# Patient Record
Sex: Female | Born: 1985 | Race: Black or African American | Hispanic: No | Marital: Married | Smoking: Never smoker
Health system: Southern US, Community
[De-identification: ages and names within clinical notes are randomized; demographics above are authoritative.]

## PROBLEM LIST (undated history)

## (undated) HISTORY — PX: HERNIA REPAIR: SHX51

## (undated) HISTORY — PX: TONSILLECTOMY: SUR1361

## (undated) HISTORY — PX: ADENOIDECTOMY: SUR15

## (undated) HISTORY — PX: MANDIBLE RECONSTRUCTION: SHX431

---

## 2007-05-18 ENCOUNTER — Ambulatory Visit (HOSPITAL_COMMUNITY): Admission: RE | Admit: 2007-05-18 | Discharge: 2007-05-19 | Payer: Self-pay | Admitting: Neurosurgery

## 2011-01-23 ENCOUNTER — Emergency Department (HOSPITAL_COMMUNITY)
Admission: EM | Admit: 2011-01-23 | Discharge: 2011-01-23 | Disposition: A | Discharge status: Home / Self Care | Payer: No Typology Code available for payment source | Attending: Emergency Medicine | Admitting: Emergency Medicine

## 2011-01-23 ENCOUNTER — Emergency Department (HOSPITAL_COMMUNITY): Payer: No Typology Code available for payment source

## 2011-01-23 DIAGNOSIS — R51 Headache: Secondary | ICD-10-CM | POA: Insufficient documentation

## 2011-01-23 DIAGNOSIS — M542 Cervicalgia: Secondary | ICD-10-CM | POA: Insufficient documentation

## 2011-01-23 DIAGNOSIS — H5710 Ocular pain, unspecified eye: Secondary | ICD-10-CM | POA: Insufficient documentation

## 2011-01-23 DIAGNOSIS — T148XXA Other injury of unspecified body region, initial encounter: Secondary | ICD-10-CM | POA: Insufficient documentation

## 2011-01-23 DIAGNOSIS — H5789 Other specified disorders of eye and adnexa: Secondary | ICD-10-CM | POA: Insufficient documentation

## 2011-01-23 DIAGNOSIS — S0510XA Contusion of eyeball and orbital tissues, unspecified eye, initial encounter: Secondary | ICD-10-CM | POA: Insufficient documentation

## 2011-01-23 DIAGNOSIS — J45909 Unspecified asthma, uncomplicated: Secondary | ICD-10-CM | POA: Insufficient documentation

## 2011-01-23 DIAGNOSIS — Y929 Unspecified place or not applicable: Secondary | ICD-10-CM | POA: Insufficient documentation

## 2011-03-02 NOTE — Discharge Summary (Signed)
Olivia Torres, Olivia Torres NO.:  [id number]   MEDICAL RECORD NO.:  [id number]          PATIENT TYPE:  OIB   LOCATION:  1539                         FACILITY:  [facility]   PHYSICIAN:  Grant Ruts., D.D.S.DATE OF BIRTH:  July 07, 1986   DATE OF ADMISSION:  05/18/2007  DATE OF DISCHARGE:  05/19/2007                               DISCHARGE SUMMARY   DISCHARGE NOTE AND SUMMARY.   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  She also had a secondary  diagnosis of asthma and she was admitted for a surgical correction of  the mandibular deficiency.  For a complete review of the physical  findings on admission, see the Admission Note and History and Physical.   LABORATORY DATA:  Her RBCs are 4.31, hemoglobin is 12, hematocrit 35.3,  normal indices.   SURGERY:  The patient was taken to surgery on the day of admission and  under general anesthesia a bilateral sagittal split osteotomy of the  mandible with advancement in rotation was completed with rigid fixation.  The patient tolerated the surgery and the anesthesia without  complications and a Class I anterior occlusal relationship was achieved  with the surgery to produce a functional bite.   COURSE IN THE HOSPITAL:  Postoperatively, the patient is now taking  fluids well and has had only minimal facial swelling.  Her airway and  respiratory has remained clear and she has had no asthmatic symptoms.  Home care was reviewed with the patient and her parents including  dietary instructions for full liquid diet, how to remove the maxillary  mandibular fixation should emesis and airway obstruction, and given a  pair of scissors to carry with her at all times.  Meticulous oral  hygiene instructions were reviewed with the patient and  her parents.   DISCHARGE MEDICATIONS:  1. Decadron 4 mg three times daily x3 days.  2. Percocet one tablet every 4 hours as needed for pain.  3. Keflex 750 mg twice daily.  4. Continue her regular asthmatic medications that include:      a.     Clarinex 5 mg daily.      b.     Singulair 10 mg daily.      c.     Albuterol inhaler as needed.   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   SECONDARY DIAGNOSIS:  Asthma.      Grant Ruts., D.D.S.  Electronically Signed     WB/MEDQ  D:  05/19/2007  T:  05/19/2007  Job:  [id number]

## 2011-03-02 NOTE — Op Note (Signed)
Olivia Torres, Olivia Torres           ACCOUNT NO.:  [id number]   MEDICAL RECORD NO.:  [id number]          PATIENT TYPE:  AMB   LOCATION:  DAY                          FACILITY:  [facility]   PHYSICIAN:  Grant Ruts., D.D.S.DATE OF BIRTH:  [birth year]   DATE OF PROCEDURE:  05/18/2007  DATE OF DISCHARGE:                               OPERATIVE REPORT   PREOPERATIVE DIAGNOSIS:  Mandibular sagittal deficiency with severe  class II malocclusion and mandibular asymmetry with deviation of the  mandibular midline significantly to the left with a complete buccal  crossbite on the right side.   POSTOPERATIVE DIAGNOSIS:  Mandibular sagittal deficiency with severe  class II malocclusion and mandibular asymmetry with deviation of the  mandibular midline significantly to the left with a complete buccal  crossbite on the right side.   PROCEDURE:  Bilateral sagittal osteotomy of the mandible with anterior  repositioning, rotation, and rigid fixation.   SURGEON:  Gwendlyn Deutscher, D.D.S.   INDICATIONS FOR PROCEDURE:  This 25 year old female has been followed in  our office for almost two years now for skeletal and occlusal  abnormalities and ability to chew food correctly.  She has been under  orthodontic care by Dr. Randall An to align the maxillary and  mandibular teeth in each individual arch.  Prior to surgery, surgical  models were completed and mounted on a semi-adjustable articulator to  allow planning of the surgical procedure.  The diagnosis was confirmed  to be severe mandibular sagittal deficiency with class II malocclusion,  rotation of the mandible to the left, and a complete cross-bite on the  right side, with a nonfunctional bite on the right side.   The surgical plan was to advance the mandible and rotate the mandible  into a class I molar and cuspid position.  This still left some width  deficiency of the mandible;however, it was felt with up-righting of the  mandibular posterior  teeth, a satisfactory occlusion could be achieved  and a significant improvement of her occlusion over her current bite  relationship.  A surgical splint guide was fabricated from the surgical  models to guide the positioning of the mandible during surgery.   Patient was taken to surgery and after satisfactory induction of  tracheal anesthesia, one moist sponge was placed in the oropharynx as a  throat pack, and the mouth was prepped with a Betadine scrub, Betadine  paint, as well as the face.  The patient was draped with four towels, a  split sheet, and a head sheet.   Using 0.5% Marcaine with 1:200,000 epinephrine, bilateral alveolar and  lingual nerve blocks were achieved for postoperative comfort and  hemostasis, and using 2% Xylocaine with 1:100,000 epinephrine,  infiltration along the anterior border of the ascending ramus and  mandible to reduce bleeding was completed.  Using electrocautery in the  cutting mode, an incision was made intraorally along the ascending ramus  of the mandible from the mid portion anteriorly and inferiorly to the  first molar tooth.  The incision was carried through the mucosa,  submucosa, buccinator muscle, periosteum, down to bone.  The temporalis  muscle fibers  were stripped superiorly to the coronoid process and the  subperiosteal dissection was carried out on the medial aspect of the  mandible, identifying the lingula.  Inferiorly on the lateral aspect,  the dissection was carried out subperiosteally to the inferior border,  and the mandibular notch.  Using appropriate retractors and a Lindeman  bur, a horizontal bone cut was made through the medial cortical plate  just superior to the lingula on the line of the plane of occlusion and  carried through the lingual cortical plate only.  Using a 701 bur, a  sagittal cut was made between the external and internal oblique ridges  from the medial cut to the second molar tooth, then using a 702 bur, a   cut was made through the inferior border of the mandible, through the  lateral cortical plate to connect with the sagittal cut.  Using thin  osteotomes and gradual splitting, the sagittal split was then deepened.  Finally, the split was completed with Katrinka Blazing splitting instruments in the  traditional manner.  The inferior alveolar neurovascular bundle was  noted to be partially contained in the lateral segment and was dissected  free without damage to the nerve and carefully placed in the distal  segment.   The mouth prop was moved to the right side, and a similar incision and  osteotomy was completed on the left ramus of the mandible.  The inferior  alveolar nerve was noted to be completely within the distal segment on  the left side of the mandible.  The mandible was freed up with a J  stripper along the inferior border on both sides and could easily be  advanced approximately 9 mm on the left side and 6 mm on the right side  and rotated to the right.   A surgical guide was then placed between the teeth, and the patient was  placed in intermaxillary fixation, thus fixating the teeth-sparing of  the mandible.  Attention was carried out to the proximal segments of the  osteotomy.  These were notched, and using posterior and superior  traction, the condyle was seated in the center portion of the glenoid  fossa, and the proximal segment was mortised to the distal segment.  On  the right side, it required some reduction of the bone structure to  allow the mortising to take place completely.  The inferior borders were  aligned and then using a modified Allis clamp, the proximal segment was  clamped to the distal segment without compression.  Using the KLS 2 mm  titanium screws, bicortical screws were placed through the superior  border of the osteotomy segments in a noncompression manner to fixate  the proximal segment to the distal segment.  Then 2 mm diameter 15 mm  screws were used on the  right side along with one 13 mm screw.  Three  screws were used on the left side and exactly on the right side, 15 mm  in length.  Two of the screws and the third screw was 13 mm in length.   The osteotomy segments were thoroughly irrigated.  They were tested and  noted to be in good rigid fixation.  The intermaxillary fixation was  removed, and the patient's occlusion was rechecked.  She rotated it with  the condyle seated exactly in the surgical splint guide.  The surgical  splint guide was removed, and she showed a class I occlusal relationship  on the cuspids and molars and midline alignment.  She  showed a slight  open bite in the posterior aspect that had been planned for on the  surgical models to allow up-righting of the posterior mandibular teeth  orthodontically.   Attention was carried out to the incisions.  These were thoroughly  irrigated.  Both incisions were closed with 4-0 Vicryl suture.   The throat pack was then removed.  The oropharynx was suctioned dry.  The patient was placed in the light  intermaxillary elastic fixation.  She was awakened and taken to the recovery room in stable condition.   Estimated blood loss is 150 cc.   COMPLICATIONS:  None.      Grant Ruts., D.D.S.  Electronically Signed     WB/MEDQ  D:  05/18/2007  T:  05/18/2007  Job:  [id number]

## 2011-08-02 LAB — CBC
HCT: 35.3 — ABNORMAL LOW
Hemoglobin: 12
MCHC: 34
MCV: 81.8 — ABNORMAL LOW
Platelets: 223
RBC: 4.31
RDW: 16.7 — ABNORMAL HIGH
WBC: 7.2

## 2011-08-02 LAB — PREGNANCY, URINE: Preg Test, Ur: NEGATIVE

## 2017-05-30 ENCOUNTER — Encounter (HOSPITAL_COMMUNITY): Payer: Self-pay | Admitting: Emergency Medicine

## 2017-05-30 ENCOUNTER — Emergency Department (HOSPITAL_COMMUNITY)
Admission: EM | Admit: 2017-05-30 | Discharge: 2017-05-30 | Disposition: A | Discharge status: Home / Self Care | Payer: 59 | Attending: Emergency Medicine | Admitting: Emergency Medicine

## 2017-05-30 ENCOUNTER — Emergency Department (HOSPITAL_COMMUNITY): Payer: 59

## 2017-05-30 DIAGNOSIS — N760 Acute vaginitis: Secondary | ICD-10-CM | POA: Diagnosis not present

## 2017-05-30 DIAGNOSIS — B9689 Other specified bacterial agents as the cause of diseases classified elsewhere: Secondary | ICD-10-CM

## 2017-05-30 DIAGNOSIS — R1031 Right lower quadrant pain: Secondary | ICD-10-CM | POA: Diagnosis not present

## 2017-05-30 DIAGNOSIS — R109 Unspecified abdominal pain: Secondary | ICD-10-CM

## 2017-05-30 LAB — COMPREHENSIVE METABOLIC PANEL
ALT: 12 U/L — AB (ref 14–54)
AST: 18 U/L (ref 15–41)
Albumin: 4.2 g/dL (ref 3.5–5.0)
Alkaline Phosphatase: 59 U/L (ref 38–126)
Anion gap: 8 (ref 5–15)
BUN: 9 mg/dL (ref 6–20)
CHLORIDE: 104 mmol/L (ref 101–111)
CO2: 25 mmol/L (ref 22–32)
CREATININE: 0.78 mg/dL (ref 0.44–1.00)
Calcium: 9.4 mg/dL (ref 8.9–10.3)
GFR calc non Af Amer: >60 mL/min (ref >60)
GLUCOSE: 100 mg/dL — AB (ref 65–99)
Potassium: 3.6 mmol/L (ref 3.5–5.1)
SODIUM: 137 mmol/L (ref 135–145)
Total Bilirubin: 0.5 mg/dL (ref 0.3–1.2)
Total Protein: 7.6 g/dL (ref 6.5–8.1)

## 2017-05-30 LAB — URINALYSIS, ROUTINE W REFLEX MICROSCOPIC
Bilirubin Urine: NEGATIVE
Glucose, UA: NEGATIVE mg/dL
Hgb urine dipstick: NEGATIVE
Ketones, ur: NEGATIVE mg/dL
Nitrite: NEGATIVE
Protein, ur: NEGATIVE mg/dL
SPECIFIC GRAVITY, URINE: 1.02 (ref 1.005–1.030)
pH: 7 (ref 5.0–8.0)

## 2017-05-30 LAB — WET PREP, GENITAL
SPERM: NONE SEEN
TRICH WET PREP: NONE SEEN
Yeast Wet Prep HPF POC: NONE SEEN

## 2017-05-30 LAB — CBC WITH DIFFERENTIAL/PLATELET
BASOS ABS: 0 10*3/uL (ref 0.0–0.1)
Basophils Relative: 1 %
EOS PCT: 4 %
Eosinophils Absolute: 0.3 10*3/uL (ref 0.0–0.7)
HEMATOCRIT: 37.1 % (ref 36.0–46.0)
HEMOGLOBIN: 12.4 g/dL (ref 12.0–15.0)
LYMPHS ABS: 2.6 10*3/uL (ref 0.7–4.0)
LYMPHS PCT: 34 %
MCH: 28.4 pg (ref 26.0–34.0)
MCHC: 33.4 g/dL (ref 30.0–36.0)
MCV: 85.1 fL (ref 78.0–100.0)
Monocytes Absolute: 0.3 10*3/uL (ref 0.1–1.0)
Monocytes Relative: 4 %
Neutro Abs: 4.3 10*3/uL (ref 1.7–7.7)
Neutrophils Relative %: 57 %
Platelets: 203 10*3/uL (ref 150–400)
RBC: 4.36 MIL/uL (ref 3.87–5.11)
RDW: 15.9 % — ABNORMAL HIGH (ref 11.5–15.5)
WBC: 7.6 10*3/uL (ref 4.0–10.5)

## 2017-05-30 LAB — PREGNANCY, URINE: PREG TEST UR: NEGATIVE

## 2017-05-30 LAB — LIPASE, BLOOD: Lipase: 26 U/L (ref 11–51)

## 2017-05-30 MED ORDER — TRAMADOL HCL 50 MG PO TABS: 50.0000 mg | ORAL_TABLET | Freq: Four times a day (QID) | ORAL | 0 refills | Status: AC | PRN

## 2017-05-30 MED ORDER — SODIUM CHLORIDE 0.9 % IV BOLUS (SEPSIS)
1000.0000 mL | Freq: Once | INTRAVENOUS | Status: AC
  Administered 2017-05-30: 1000 mL via INTRAVENOUS

## 2017-05-30 MED ORDER — METRONIDAZOLE 500 MG PO TABS: 500.0000 mg | ORAL_TABLET | Freq: Two times a day (BID) | ORAL | 0 refills | Status: AC

## 2017-05-30 MED ORDER — IOPAMIDOL (ISOVUE-300) INJECTION 61%
100.0000 mL | Freq: Once | INTRAVENOUS | Status: AC | PRN
  Administered 2017-05-30: 100 mL via INTRAVENOUS

## 2017-05-30 MED ORDER — KETOROLAC TROMETHAMINE 30 MG/ML IJ SOLN
30.0000 mg | Freq: Once | INTRAMUSCULAR | Status: AC
  Administered 2017-05-30: 30 mg via INTRAVENOUS
  Filled 2017-05-30: qty 1

## 2017-05-30 MED ORDER — IBUPROFEN 800 MG PO TABS: 800.0000 mg | ORAL_TABLET | Freq: Three times a day (TID) | ORAL | 0 refills | Status: AC | PRN

## 2017-05-30 NOTE — ED Provider Notes (Signed)
Emergency Department Provider Note   I have reviewed the triage vital signs and the nursing notes.   HISTORY  Chief Complaint Flank Pain   HPI Olivia Torres is a 31 y.o. female with PMH of bilateral inguinal hernia repair presents to the emergency department for evaluation of right lower quadrant abdominal pain over the past 2-3 days. Patient states she's had one to 2 weeks of bilateral back discomfort. This seemed to resolve around the time that the right lower quadrant pain began. Her last missed her period was 2 weeks ago. She denies any dysuria, hesitancy, urgency. No vaginal bleeding or discharge currently. No similar pain in the past. She has tried over-the-counter pain medications with no relief. She denies any associated nausea or vomiting. She continues to have regular bowel movements with no worsening pain. No rectal discomfort. She reports past surgical history of inguinal hernia repair bilaterally but her knowledge the appendix was not removed during these procedures. Denies fever or chills. No radiation of symptoms. Pain sometimes worse with movement.    History reviewed. No pertinent past medical history.  There are no active problems to display for this patient.   Past Surgical History:  Procedure Laterality Date  . ADENOIDECTOMY    . HERNIA REPAIR    . MANDIBLE RECONSTRUCTION    . TONSILLECTOMY        Allergies Augmentin [amoxicillin-pot clavulanate]; Beeswax; and Fish allergy  History reviewed. No pertinent family history.  Social History Social History  Substance Use Topics  . Smoking status: Never Smoker  . Smokeless tobacco: Never Used  . Alcohol use Yes     Comment: occ    Review of Systems  Constitutional: No fever/chills Eyes: No visual changes. ENT: No sore throat. Cardiovascular: Denies chest pain. Respiratory: Denies shortness of breath. Gastrointestinal: Positive RLQ abdominal pain.  No nausea, no vomiting.  No diarrhea.  No  constipation. Genitourinary: Negative for dysuria. Musculoskeletal: Negative for back pain. Skin: Negative for rash. Neurological: Negative for headaches, focal weakness or numbness.  10-point ROS otherwise negative.  ____________________________________________   PHYSICAL EXAM:  VITAL SIGNS: ED Triage Vitals  Enc Vitals Group     BP 05/30/17 0908 118/78     Pulse Rate 05/30/17 0908 83     Resp 05/30/17 0908 16     Temp 05/30/17 0908 98.7 F (37.1 C)     Temp Source 05/30/17 0908 Oral     SpO2 05/30/17 0908 100 %     Weight 05/30/17 0903 173 lb (78.5 kg)     Height 05/30/17 0903 5\' 5"  (1.651 m)     Pain Score 05/30/17 0903 10   Constitutional: Alert and oriented. Well appearing and in no acute distress. Notably uncomfortable when lying flat.  Eyes: Conjunctivae are normal.  Head: Atraumatic. Nose: No congestion/rhinnorhea. Mouth/Throat: Mucous membranes are moist.   Neck: No stridor.  Cardiovascular: Normal rate, regular rhythm. Good peripheral circulation. Grossly normal heart sounds.   Respiratory: Normal respiratory effort.  No retractions. Lungs CTAB. Gastrointestinal: Soft with focal RLQ tenderness to palpation. No rebound or guarding. Negative Murphy's sign. No distention. No CVA tenderness. GU exam with moderate discharge. No CMT. Mild right adnexal discomfort but no fullness. Normal left adnexa.  Musculoskeletal: No lower extremity tenderness nor edema. No gross deformities of extremities. Neurologic:  Normal speech and language. No gross focal neurologic deficits are appreciated.  Skin:  Skin is warm, dry and intact. No rash noted. Psychiatric: Mood and affect are normal. Speech and  behavior are normal.  ____________________________________________   LABS (all labs ordered are listed, but only abnormal results are displayed)  Labs Reviewed  WET PREP, GENITAL - Abnormal; Notable for the following:       Result Value   Clue Cells Wet Prep HPF POC PRESENT (*)      WBC, Wet Prep HPF POC RARE (*)    All other components within normal limits  URINALYSIS, ROUTINE W REFLEX MICROSCOPIC - Abnormal; Notable for the following:    APPearance HAZY (*)    Leukocytes, UA SMALL (*)    Bacteria, UA RARE (*)    Squamous Epithelial / LPF 6-30 (*)    All other components within normal limits  COMPREHENSIVE METABOLIC PANEL - Abnormal; Notable for the following:    Glucose, Bld 100 (*)    ALT 12 (*)    All other components within normal limits  CBC WITH DIFFERENTIAL/PLATELET - Abnormal; Notable for the following:    RDW 15.9 (*)    All other components within normal limits  PREGNANCY, URINE  LIPASE, BLOOD  GC/CHLAMYDIA PROBE AMP ([place]) NOT AT [facility]   ____________________________________________  RADIOLOGY  Ct Abdomen Pelvis W Contrast  Result Date: 05/30/2017 CLINICAL DATA:  Left flank pain for 2 weeks. Now has right flank pain. EXAM: CT ABDOMEN AND PELVIS WITH CONTRAST TECHNIQUE: Multidetector CT imaging of the abdomen and pelvis was performed using the standard protocol following bolus administration of intravenous contrast. CONTRAST:  ISOVUE-300 IOPAMIDOL (ISOVUE-300) INJECTION 61% COMPARISON:  None. FINDINGS: Lower chest: No acute abnormality. Hepatobiliary: No focal liver abnormality is seen. No gallstones, gallbladder wall thickening, or biliary dilatation. Pancreas: Unremarkable. No pancreatic ductal dilatation or surrounding inflammatory changes. Spleen: Normal in size without focal abnormality. Adrenals/Urinary Tract: Adrenal glands are unremarkable. Kidneys are normal, without renal calculi, focal lesion, or hydronephrosis. Bladder is unremarkable. Stomach/Bowel: Stomach is within normal limits. Appendix appears normal. No evidence of bowel wall thickening, distention, or inflammatory changes. Vascular/Lymphatic: No significant vascular findings are present. No enlarged abdominal or pelvic lymph nodes. Reproductive: Uterus and bilateral adnexa  are unremarkable. 16 mm dominant left ovarian follicle. Other: No abdominal wall hernia or abnormality. Small amount of pelvic free fluid. Musculoskeletal: No acute osseous abnormality. No lytic or sclerotic osseous lesion. IMPRESSION: 1. No acute abdominal or pelvic pathology. 2. Normal appendix. 3. No urolithiasis or obstructive uropathy. Electronically Signed   By: Elige Ko   On: 05/30/2017 11:37    ____________________________________________   PROCEDURES  Procedure(s) performed:   Procedures  None ____________________________________________   INITIAL IMPRESSION / ASSESSMENT AND PLAN / ED COURSE  Pertinent labs & imaging results that were available during my care of the patient were reviewed by me and considered in my medical decision making (see chart for details).  Patient presents to the emergency department for evaluation of right lower quadrant abdominal pain. Symptoms have been ongoing for 2-3 days. She has focal tenderness in the right lower quadrant. She has a history of bilateral hernia repair but believes that her appendix is still in place. Negative Murphy's sign. LMP 2 weeks prior. No fever or chills. Plan for CT abdomen/pelvis with contrast, labs, and pain control.   Differential diagnosis includes but is not exclusive to ectopic pregnancy, ovarian cyst, ovarian torsion, acute appendicitis, urinary tract infection, endometriosis, bowel obstruction, hernia, colitis, renal colic, gastroenteritis, volvulus etc.  CT scan with no acute findings. Pelvic exam with no cervical motion tenderness. She does have clue cells which correlate clinically with her  moderate vaginal discharge. Plan for treatment with Flagyl and discharge home with primary care physician follow-up. No clinical evidence to suggest ovarian torsion. Normal appearing ovaries on CT so low suspicion for TOA. Patient is feeling better at discharge. Plan for PCP and Gyn follow up. Discussed return precautions in  detail.   At this time, I do not feel there is any life-threatening condition present. I have reviewed and discussed all results (EKG, imaging, lab, urine as appropriate), exam findings with patient. I have reviewed nursing notes and appropriate previous records.  I feel the patient is safe to be discharged home without further emergent workup. Discussed usual and customary return precautions. Patient and family (if present) verbalize understanding and are comfortable with this plan.  Patient will follow-up with their primary care provider. If they do not have a primary care provider, information for follow-up has been provided to them. All questions have been answered.   ____________________________________________  FINAL CLINICAL IMPRESSION(S) / ED DIAGNOSES  Final diagnoses:  Right flank pain  Right lower quadrant abdominal pain  Bacterial vaginosis     MEDICATIONS GIVEN DURING THIS VISIT:  Medications  sodium chloride 0.9 % bolus 1,000 mL (0 mLs Intravenous Stopped 05/30/17 1117)  ketorolac (TORADOL) 30 MG/ML injection 30 mg (30 mg Intravenous Given 05/30/17 0939)  iopamidol (ISOVUE-300) 61 % injection 100 mL (100 mLs Intravenous Contrast Given 05/30/17 1122)     NEW OUTPATIENT MEDICATIONS STARTED DURING THIS VISIT:  Discharge Medication List as of 05/30/2017  1:13 PM    START taking these medications   Details  ibuprofen (ADVIL,MOTRIN) 800 MG tablet Take 1 tablet (800 mg total) by mouth every 8 (eight) hours as needed., Starting Mon 05/30/2017, Print    metroNIDAZOLE (FLAGYL) 500 MG tablet Take 1 tablet (500 mg total) by mouth 2 (two) times daily., Starting Mon 05/30/2017, Print    traMADol (ULTRAM) 50 MG tablet Take 1 tablet (50 mg total) by mouth every 6 (six) hours as needed., Starting Mon 05/30/2017, Print          Note:  This document was prepared using Dragon voice recognition software and may include unintentional dictation errors.  Alona BeneJoshua Breunna Nordmann, MD Emergency  Medicine    Sante Biedermann, Arlyss RepressJoshua G, MD 05/30/17 [phone]

## 2017-05-30 NOTE — Discharge Instructions (Signed)

## 2017-05-30 NOTE — ED Triage Notes (Signed)
Pt c/o pain 2 weeks ago to left flank area, then to right flank and now around to r upper and loer abd and lower back pain. Denies gu sx's and states has normal bms. Nad.

## 2017-05-30 NOTE — ED Notes (Signed)
Pt made aware to return if symptoms worsen or if any life threatening symptoms occur.   

## 2017-06-01 LAB — GC/CHLAMYDIA PROBE AMP (~~LOC~~) NOT AT ARMC
CHLAMYDIA, DNA PROBE: NEGATIVE
Neisseria Gonorrhea: NEGATIVE

## 2017-07-07 ENCOUNTER — Encounter: Payer: Self-pay | Admitting: Adult Health

## 2018-01-09 IMAGING — CT CT ABD-PELV W/ CM
2 of 3 series · 16 of 46 positions shown, 18 images · IV contrast (Isovue)
Comparison: None.

CLINICAL DATA: Left flank pain for 2 weeks. Now has right flank
pain.

EXAM:
CT ABDOMEN AND PELVIS WITH CONTRAST
TECHNIQUE: Multidetector CT imaging of the abdomen and pelvis was performed
using the standard protocol following bolus administration of
intravenous contrast.
CONTRAST:  100mL R2T21T-HGG IOPAMIDOL (R2T21T-HGG) INJECTION 61%

[Series 2: axial st · axial · 0.60mm/px · z∈[+1026,+1421]mm · 13 of 91 slices shown, 15 images]
[im 6/91  soft-tissue]
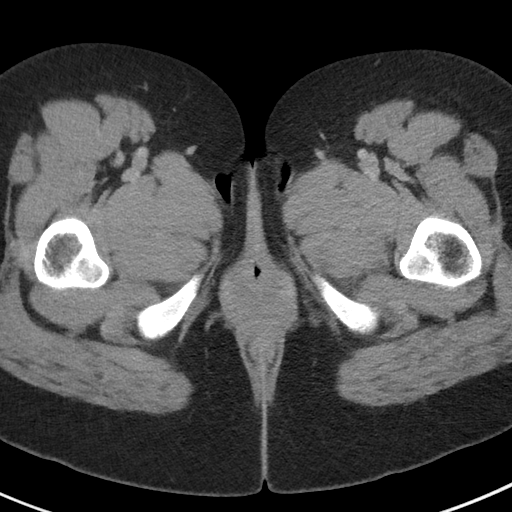
[im 6/91  bone]
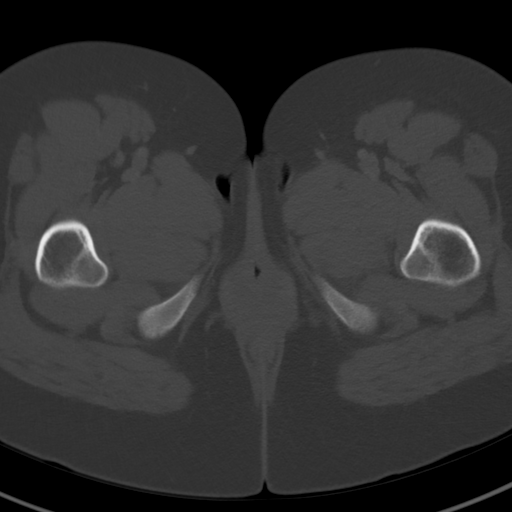
[im 12/91  soft-tissue]
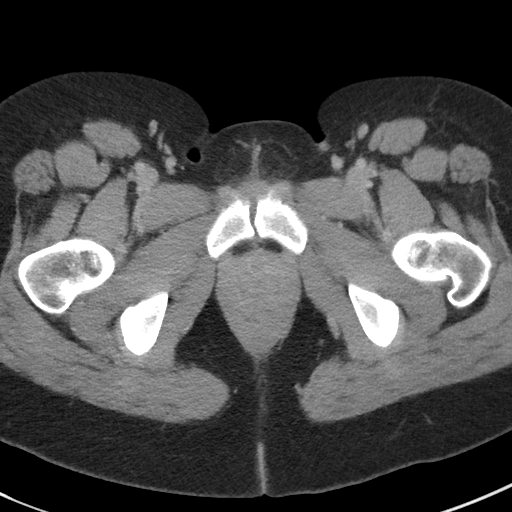
[im 18/91  soft-tissue]
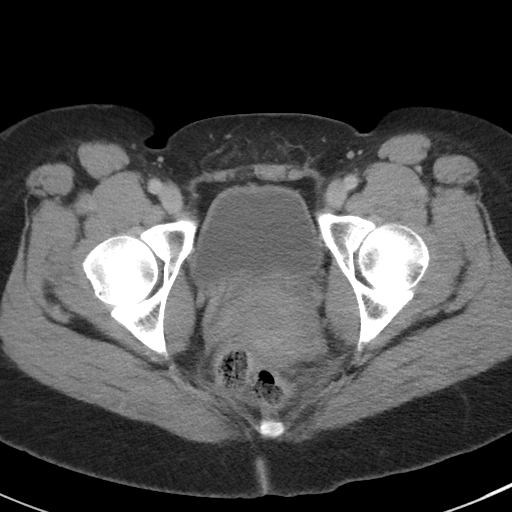
[im 27/91  soft-tissue]
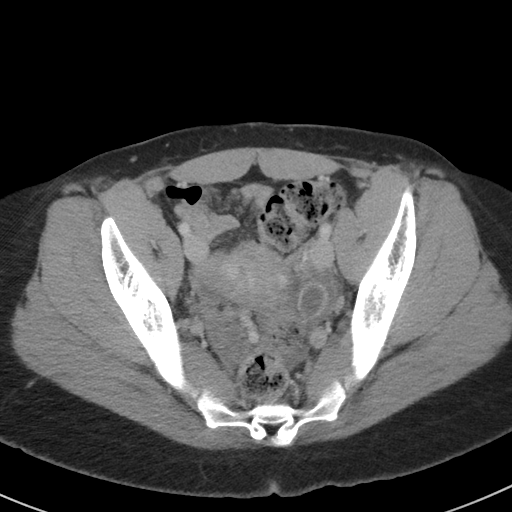
[im 32/91  soft-tissue]
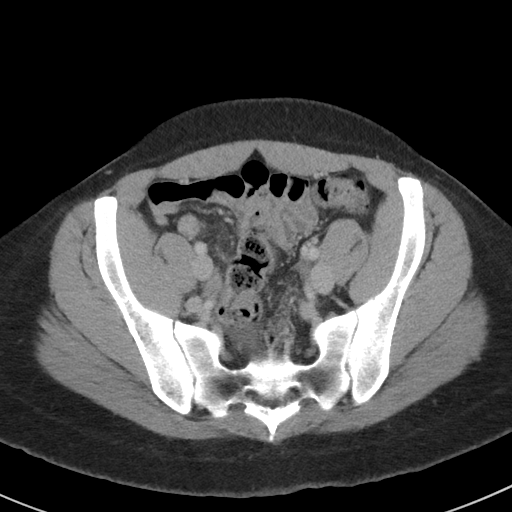
[im 38/91  soft-tissue]
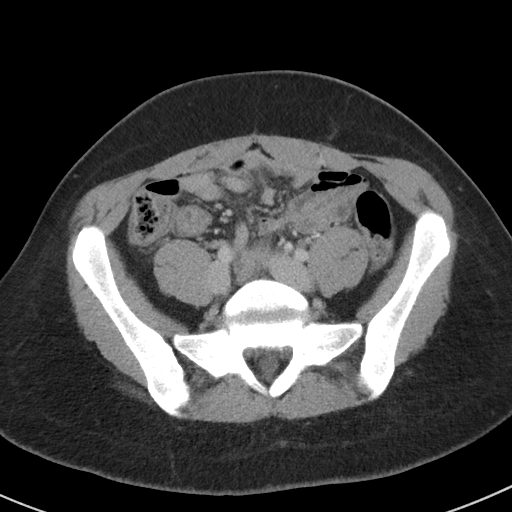
[im 47/91  soft-tissue]
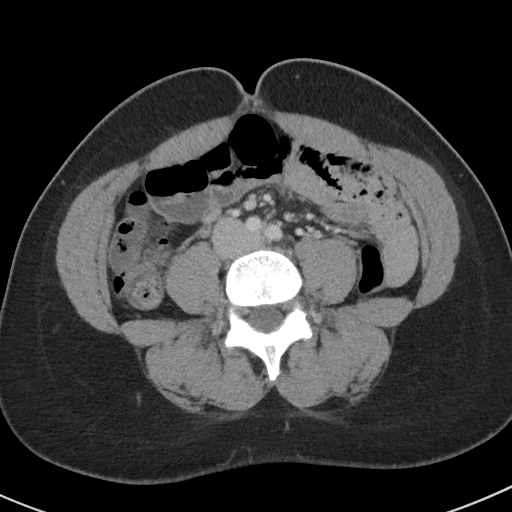
[im 53/91  soft-tissue]
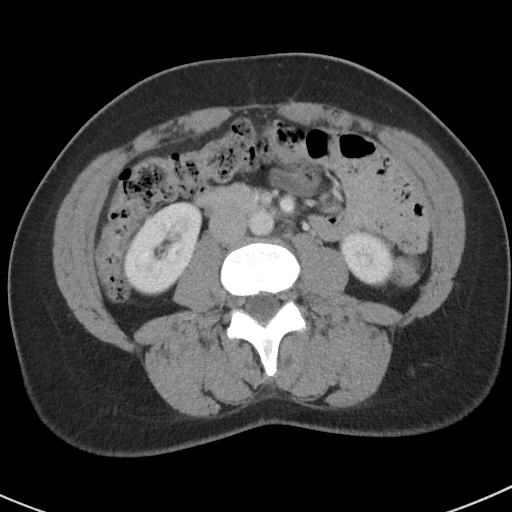
[im 59/91  soft-tissue]
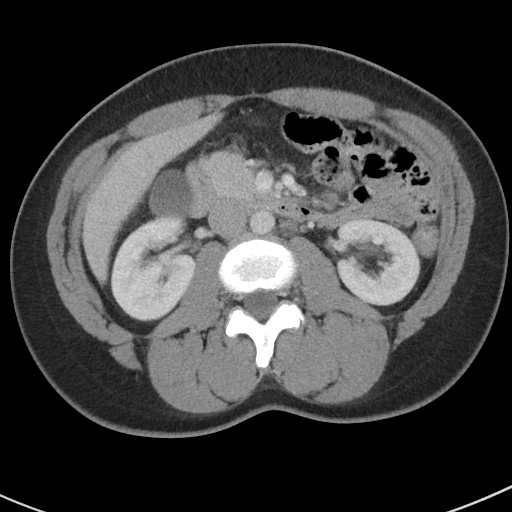
[im 59/91  bone]
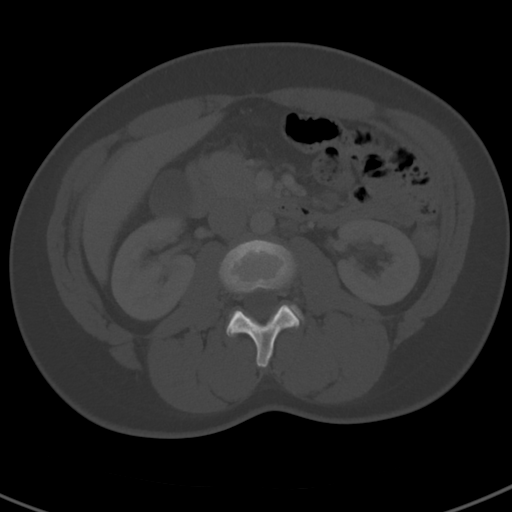
[im 64/91  soft-tissue]
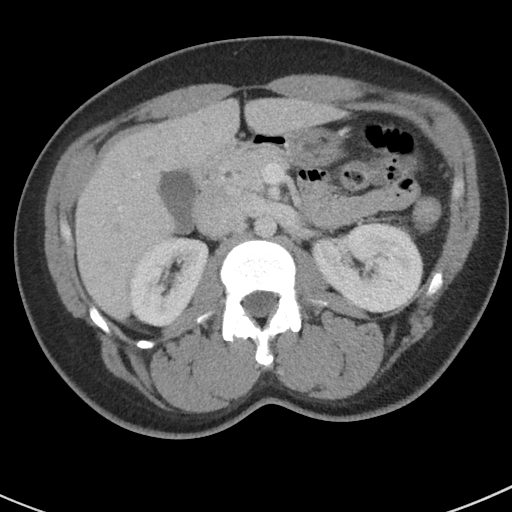
[im 73/91  soft-tissue]
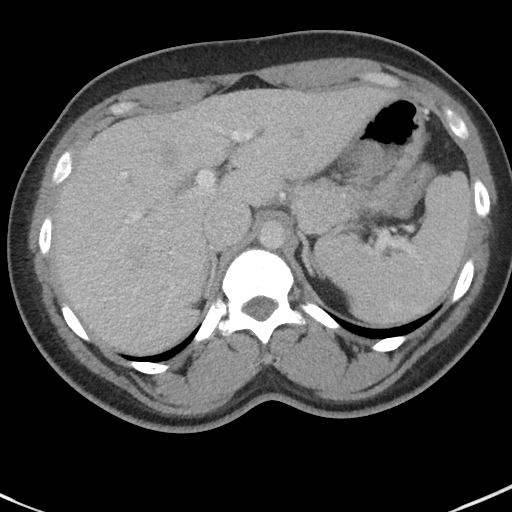
[im 79/91  soft-tissue]
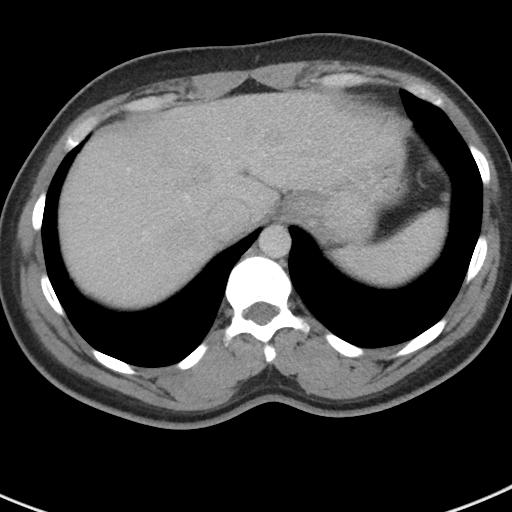
[im 85/91  soft-tissue]
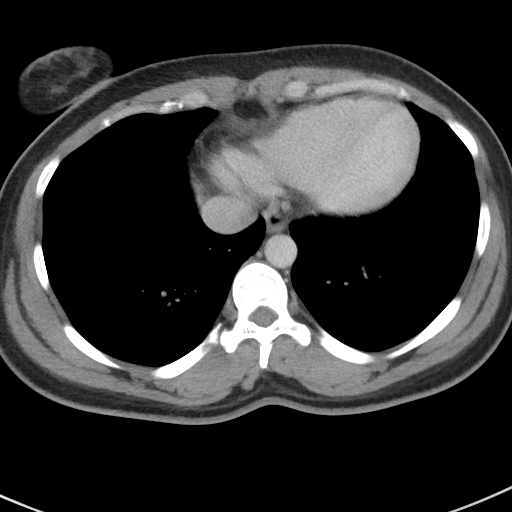

[Series 5: coronal st · coronal · 0.67mm/px · 3 of 86 slices shown]
[im 29/86  soft-tissue]
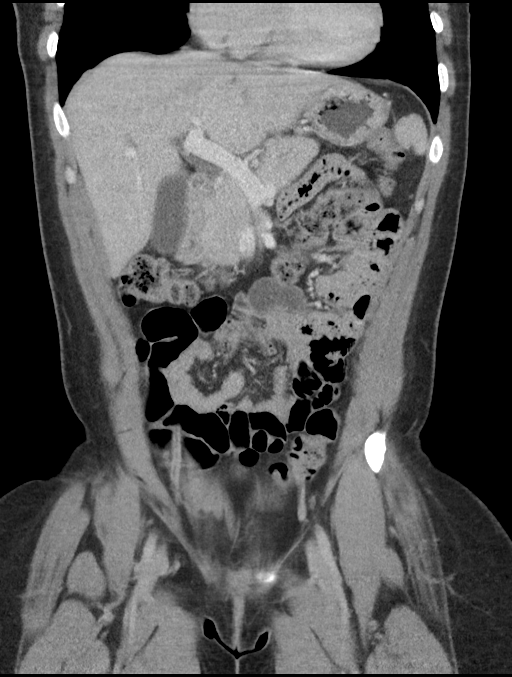
[im 38/86  soft-tissue]
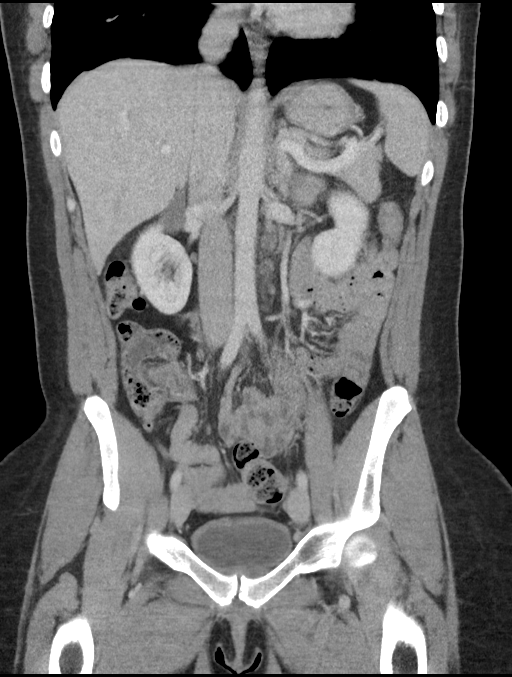
[im 48/86  soft-tissue]
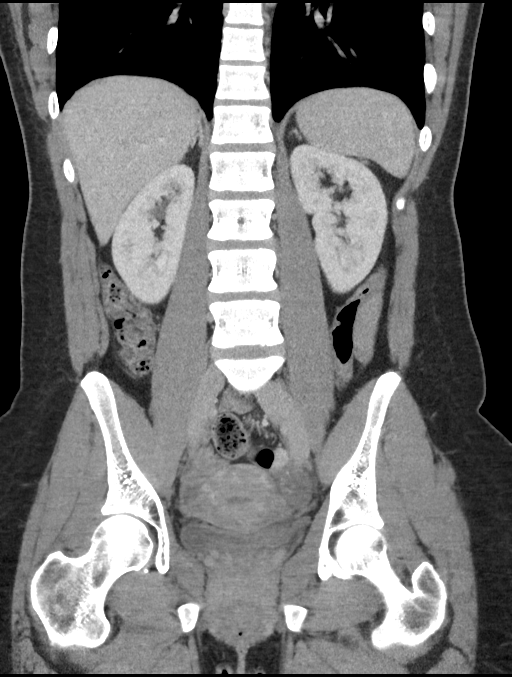

[16 of 46 positions shown; findings below may reference images not displayed]

FINDINGS: Lower chest: No acute abnormality.

Hepatobiliary: No focal liver abnormality is seen. No gallstones,
gallbladder wall thickening, or biliary dilatation.

Pancreas: Unremarkable. No pancreatic ductal dilatation or
surrounding inflammatory changes.

Spleen: Normal in size without focal abnormality.

Adrenals/Urinary Tract: Adrenal glands are unremarkable. Kidneys are
normal, without renal calculi, focal lesion, or hydronephrosis.
Bladder is unremarkable.

Stomach/Bowel: Stomach is within normal limits. Appendix appears
normal. No evidence of bowel wall thickening, distention, or
inflammatory changes.

Vascular/Lymphatic: No significant vascular findings are present. No
enlarged abdominal or pelvic lymph nodes.

Reproductive: Uterus and bilateral adnexa are unremarkable. 16 mm
dominant left ovarian follicle.

Other: No abdominal wall hernia or abnormality. Small amount of
pelvic free fluid.

Musculoskeletal: No acute osseous abnormality. No lytic or sclerotic
osseous lesion.
IMPRESSION: 1. No acute abdominal or pelvic pathology.
2. Normal appendix.
3. No urolithiasis or obstructive uropathy.

## 2020-04-15 ENCOUNTER — Ambulatory Visit (HOSPITAL_COMMUNITY): Payer: 59

## 2020-04-15 ENCOUNTER — Ambulatory Visit
Admission: EM | Admit: 2020-04-15 | Discharge: 2020-04-15 | Disposition: A | Discharge status: Home / Self Care | Payer: 59

## 2020-04-15 ENCOUNTER — Other Ambulatory Visit: Payer: Self-pay

## 2020-04-15 ENCOUNTER — Ambulatory Visit (INDEPENDENT_AMBULATORY_CARE_PROVIDER_SITE_OTHER): Payer: 59

## 2020-04-15 ENCOUNTER — Ambulatory Visit: Payer: 59

## 2020-04-15 ENCOUNTER — Encounter: Payer: Self-pay | Admitting: Emergency Medicine

## 2020-04-15 DIAGNOSIS — M79641 Pain in right hand: Secondary | ICD-10-CM | POA: Diagnosis not present

## 2020-04-15 DIAGNOSIS — M25531 Pain in right wrist: Secondary | ICD-10-CM

## 2020-04-15 MED ORDER — PREDNISONE 10 MG PO TABS: 20.0000 mg | ORAL_TABLET | Freq: Every day | ORAL | 0 refills | Status: AC

## 2020-04-15 NOTE — ED Provider Notes (Addendum)
[facility]   [id number] 04/15/20 Arrival Time: 0835   Chief Complaint  Patient presents with  . Hand Pain    SUBJECTIVE: History from: patient.  Olivia Torres is a 34 y.o. female who presented to the urgent care with a complaint of right hand/ wrist pain for the past 3 to 4 years.  Reports she developed pain after she fell a pop in her hand while taking care of her daughter.  She localizes the pain to the right hand/wrist.  She describes the pain as constant and achy.  She has tried OTC medications without relief.  His symptoms are made worse with ROM.  She denies similar symptoms in the past.  Denies chills, fever, nausea, vomiting, diarrhea.   ROS: As per HPI.  All other pertinent ROS negative.      History reviewed. No pertinent past medical history. Past Surgical History:  Procedure Laterality Date  . ADENOIDECTOMY    . HERNIA REPAIR    . MANDIBLE RECONSTRUCTION    . TONSILLECTOMY     Allergies  Allergen Reactions  . Augmentin [Amoxicillin-Pot Clavulanate]   . Beeswax   . Fish Allergy    No current facility-administered medications on file prior to encounter.   Current Outpatient Medications on File Prior to Encounter  Medication Sig Dispense Refill  . Multiple Vitamin (MULTIVITAMIN) tablet Take 1 tablet by mouth daily.    Marland Kitchen ibuprofen (ADVIL,MOTRIN) 800 MG tablet Take 1 tablet (800 mg total) by mouth every 8 (eight) hours as needed. 21 tablet 0  . metroNIDAZOLE (FLAGYL) 500 MG tablet Take 1 tablet (500 mg total) by mouth 2 (two) times daily. 14 tablet 0  . naproxen sodium (ANAPROX) 220 MG tablet Take 440 mg by mouth 2 (two) times daily with a meal.    . traMADol (ULTRAM) 50 MG tablet Take 1 tablet (50 mg total) by mouth every 6 (six) hours as needed. 15 tablet 0   Social History   Socioeconomic History  . Marital status: Married    Spouse name: Not on file  . Number of children: Not on file  . Years of education: Not on file  . Highest  education level: Not on file  Occupational History  . Not on file  Tobacco Use  . Smoking status: Never Smoker  . Smokeless tobacco: Never Used  Vaping Use  . Vaping Use: Never used  Substance and Sexual Activity  . Alcohol use: Yes    Comment: occ  . Drug use: No  . Sexual activity: Not on file  Other Topics Concern  . Not on file  Social History Narrative  . Not on file   Social Determinants of Health   Financial Resource Strain:   . Difficulty of Paying Living Expenses:   Food Insecurity:   . Worried About Programme researcher, broadcasting/film/video in the Last Year:   . Barista in the Last Year:   Transportation Needs:   . Freight forwarder (Medical):   Marland Kitchen Lack of Transportation (Non-Medical):   Physical Activity:   . Days of Exercise per Week:   . Minutes of Exercise per Session:   Stress:   . Feeling of Stress :   Social Connections:   . Frequency of Communication with Friends and Family:   . Frequency of Social Gatherings with Friends and Family:   . Attends Religious Services:   . Active Member of Clubs or Organizations:   . Attends Banker Meetings:   .  Marital Status:   Intimate Partner Violence:   . Fear of Current or Ex-Partner:   . Emotionally Abused:   Marland Kitchen Physically Abused:   . Sexually Abused:    Family History  Problem Relation Age of Onset  . Hypertension Mother   . Diabetes Mother     OBJECTIVE:  Vitals:   04/15/20 0849 04/15/20 0850  BP: 122/76   Pulse: 75   Resp: 17   Temp: 98.2 F (36.8 C)   TempSrc: Oral   SpO2: 97%   Weight:  209 lb (94.8 kg)  Height:  5\' 6"  (1.676 m)     Physical Exam Vitals and nursing note reviewed.  Constitutional:      General: She is not in acute distress.    Appearance: Normal appearance. She is normal weight. She is not ill-appearing, toxic-appearing or diaphoretic.  Cardiovascular:     Rate and Rhythm: Normal rate and regular rhythm.     Pulses: Normal pulses.     Heart sounds: Normal heart  sounds. No murmur heard.  No friction rub. No gallop.   Pulmonary:     Effort: Pulmonary effort is normal. No respiratory distress.     Breath sounds: Normal breath sounds. No stridor. No wheezing, rhonchi or rales.  Chest:     Chest wall: No tenderness.  Musculoskeletal:        General: Tenderness present. No swelling, deformity or signs of injury.     Right lower leg: No edema.     Left lower leg: No edema.     Comments: The right hand is without obvious deformity when compared to the left hand.  No swelling, erythema, atrophy, obvious deformity, surface trauma, open wound, nail avulsion, tissue avulsion, partial or complete amputation, subungual hematoma, bleeding deformity.  Normal cascade of finger.  Normal range of motion.  Pulse and capillary refill intact.  Neurological:     Mental Status: She is alert.     LABS:  No results found for this or any previous visit (from the past 24 hour(s)).   ASSESSMENT & PLAN:  1. Right wrist pain   2. Right hand pain     Meds ordered this encounter  Medications  . predniSONE (DELTASONE) 10 MG tablet    Sig: Take 2 tablets (20 mg total) by mouth daily.    Dispense:  15 tablet    Refill:  0   Patient is stable at discharge.  Her symptom is most likely from her carpal tunnel syndrome.  Prednisone was prescribed.  Was advised to take OTC Tylenol as needed for pain.  Right hand X-ray is negative for bony abnormality including fracture or dislocation.  I have reviewed the x-ray myself and the radiologist interpretation.  I am in agreement with the radiologist interpretation.  Discharge Instructions  Follow RICE instruction that is attached Prednisone was prescribed/take as directed Use OTC Tylenol as needed for pain Use a wrist splint/can be found at [facility], [facility], [facility] Follow-up with PCP Return or go to ED for worsening symptoms  Reviewed expectations re: course of current medical issues. Questions answered. Outlined signs and  symptoms indicating need for more acute intervention. Patient verbalized understanding. After Visit Summary given.      Note: This document was prepared using Dragon voice recognition software and may include unintentional dictation errors.    , FNP 04/15/20 0935    04/17/20, FNP 04/15/20 [phone]

## 2020-04-15 NOTE — Discharge Instructions (Addendum)
Follow RICE instruction that is attached Prednisone was prescribed/take as directed Use OTC Tylenol as needed for pain Use a wrist splint/can be found at [facility], [facility], [facility] Follow-up with PCP Return or go to ED for worsening symptoms

## 2020-04-15 NOTE — ED Triage Notes (Signed)
Pain to RT hand that radiates through to RT wrist  on and off 3-4 years.  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  States yesterday she was taking care of her daughter and she felt something pop, since then she has had constant pain.

## 2020-06-19 ENCOUNTER — Ambulatory Visit: Payer: Self-pay | Attending: Internal Medicine

## 2020-06-19 DIAGNOSIS — Z23 Encounter for immunization: Secondary | ICD-10-CM

## 2020-06-19 NOTE — Progress Notes (Signed)
   Covid-19 Vaccination Clinic  Name:  Olivia Torres    MRN: [id number] DOB: [birth year]  06/19/2020  Ms. Olivia Torres was observed post Covid-19 immunization for 30 minutes based on pre-vaccination screening without incident. She was provided with Vaccine Information Sheet and instruction to access the V-Safe system.   Ms. Olivia Torres was instructed to call 911 with any severe reactions post vaccine: Marland Kitchen Difficulty breathing  . Swelling of face and throat  . A fast heartbeat  . A bad rash all over body  . Dizziness and weakness   Immunizations Administered    Name Date Dose VIS Date Route   Pfizer COVID-19 Vaccine 06/19/2020 11:34 AM 0.3 mL 12/12/2018 Intranasal   Manufacturer: [employer], [employer]   Lot: [id number]   NDC: [address]-0

## 2020-07-10 ENCOUNTER — Ambulatory Visit: Payer: Self-pay

## 2020-11-25 IMAGING — DX DG HAND COMPLETE 3+V*R*
3 series · 3 of 3 positions shown · non-contrast
Comparison: None

CLINICAL DATA: Pain in right hand and wrist for 1 week. Pain along
metacarpals and lateral side of wrist.

EXAM:
RIGHT HAND - COMPLETE 3+ VIEW

[hand pa]
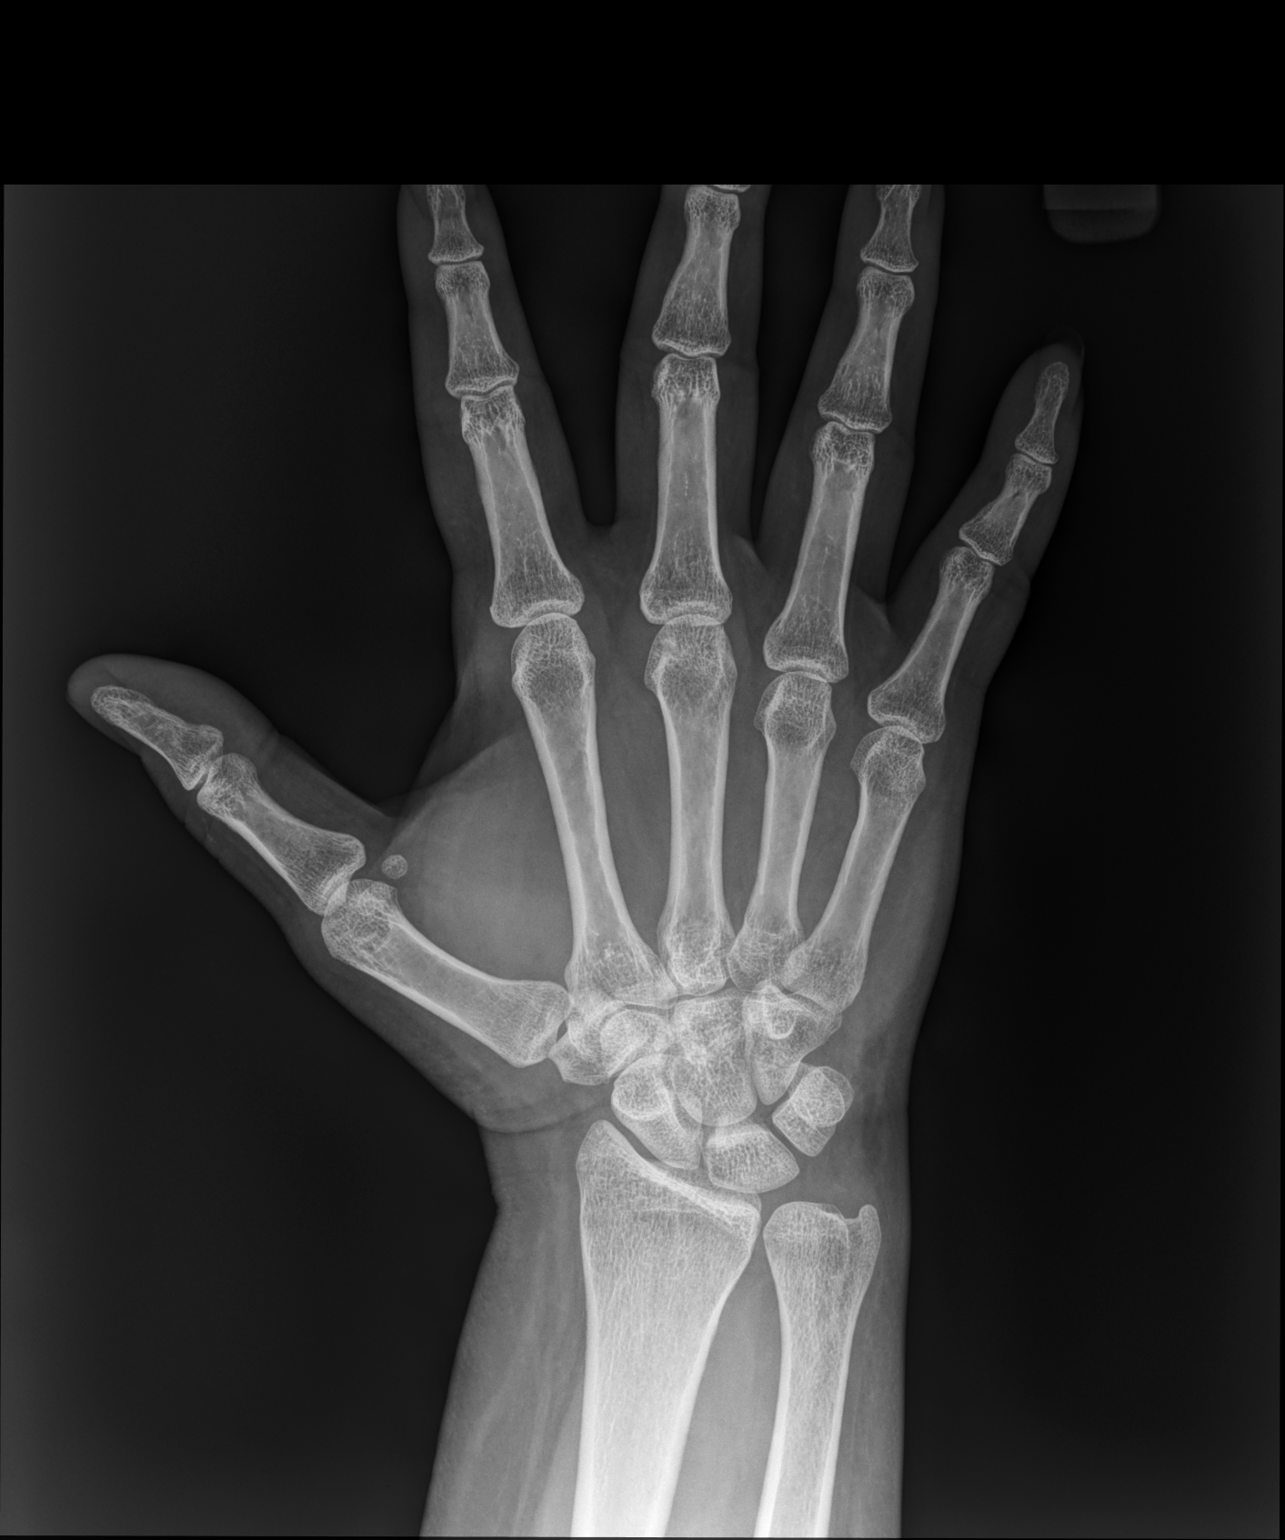

[hand mlo]
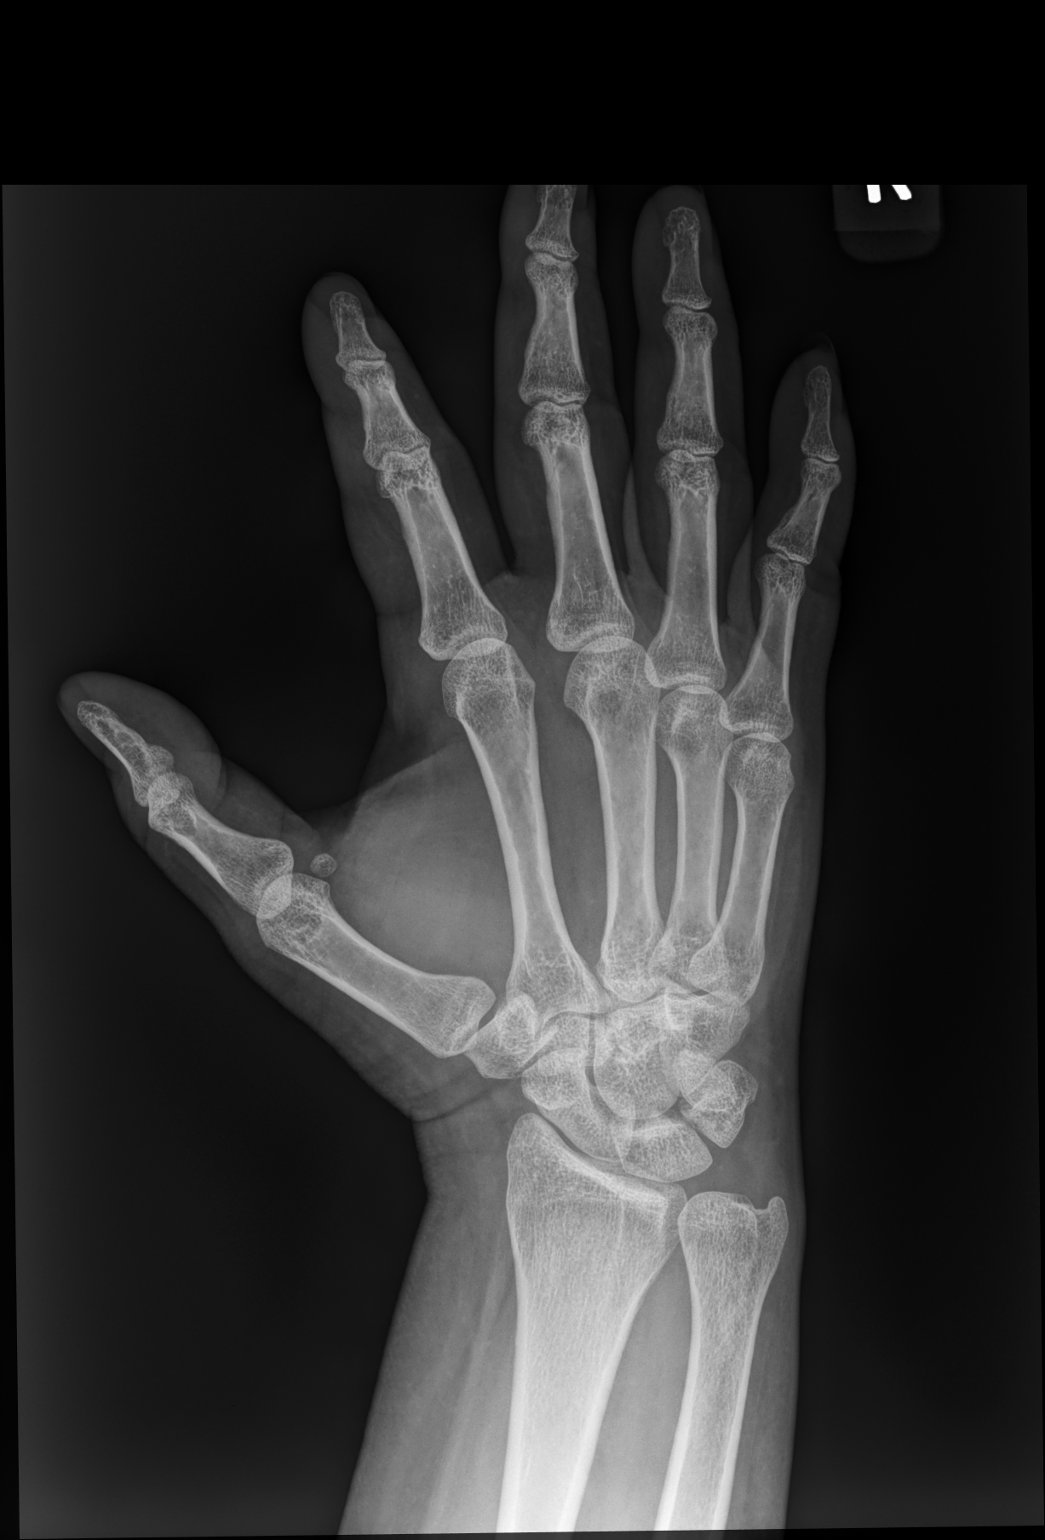

[hand lat]
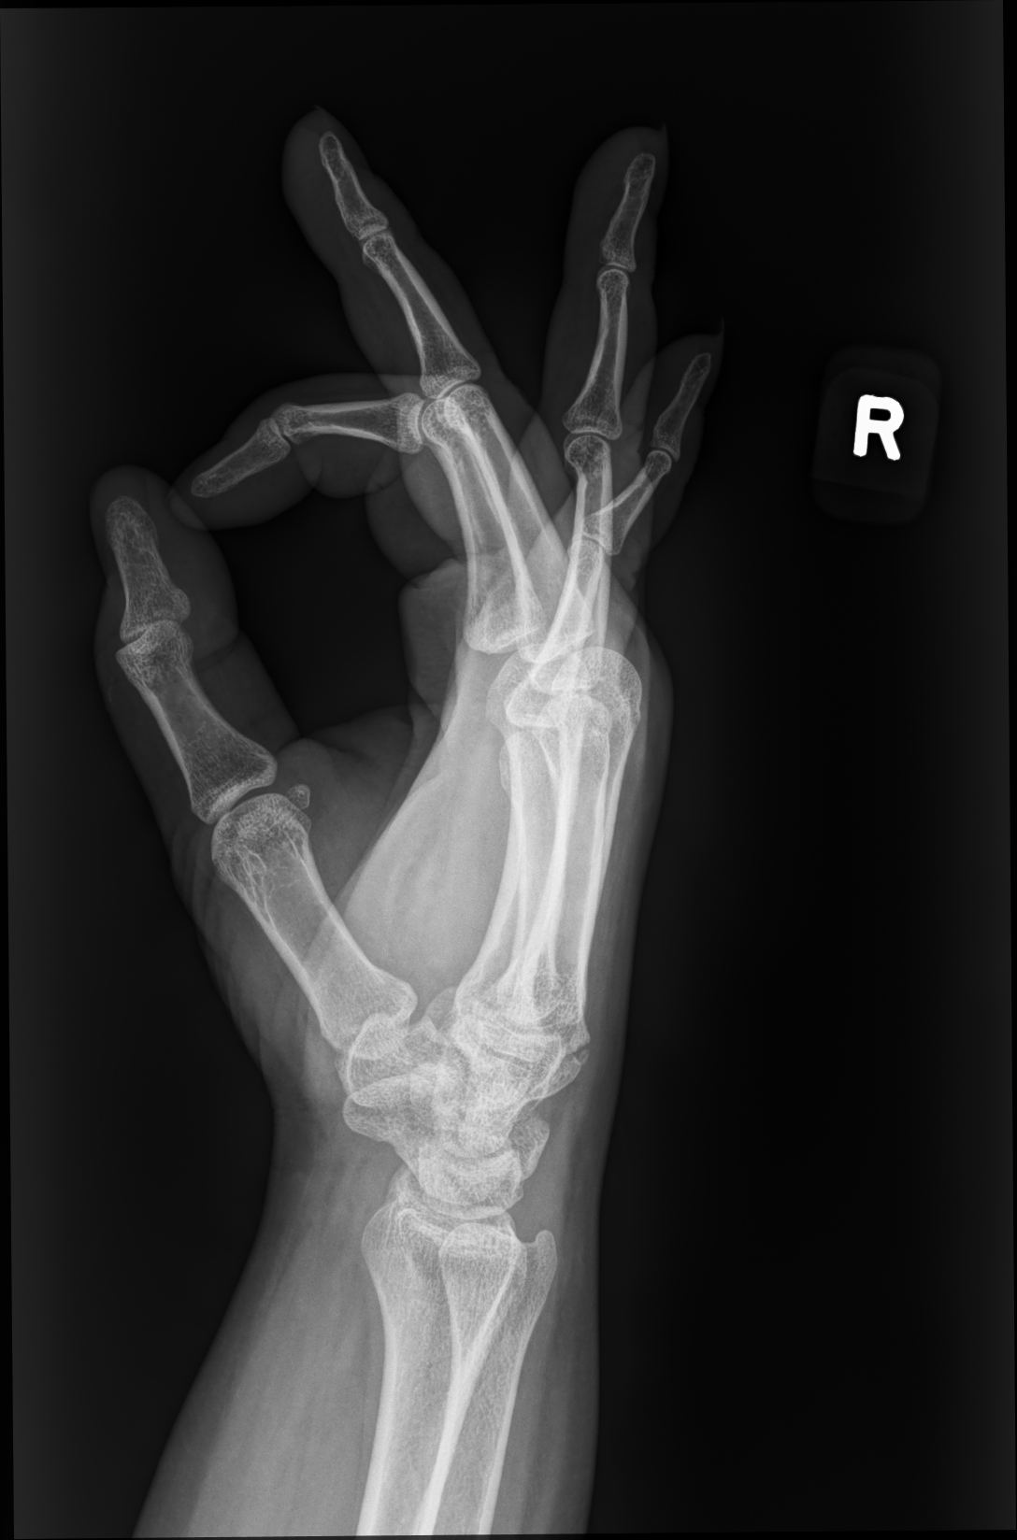

[3 of 3 positions shown; findings below may reference images not displayed]

FINDINGS: Normal bone mineralization. Joint spaces are well preserved. No
focal bone erosions. No fracture or dislocation identified. Soft
tissues are normal.
IMPRESSION: Negative.

## 2024-04-09 ENCOUNTER — Other Ambulatory Visit (HOSPITAL_COMMUNITY): Payer: Self-pay | Admitting: Family Medicine

## 2024-04-09 DIAGNOSIS — M5030 Other cervical disc degeneration, unspecified cervical region: Secondary | ICD-10-CM

## 2024-04-14 ENCOUNTER — Ambulatory Visit (HOSPITAL_COMMUNITY)
Admission: RE | Admit: 2024-04-14 | Discharge: 2024-04-14 | Disposition: A | Discharge status: Home / Self Care | Source: Ambulatory Visit | Attending: Family Medicine | Admitting: Family Medicine

## 2024-04-14 DIAGNOSIS — M5030 Other cervical disc degeneration, unspecified cervical region: Secondary | ICD-10-CM | POA: Insufficient documentation
